# Patient Record
Sex: Female | Born: 1965 | Race: White | Hispanic: No | Marital: Married | Smoking: Former smoker
Health system: Southern US, Community
[De-identification: ages and names within clinical notes are randomized; demographics above are authoritative.]

---

## 1998-02-25 ENCOUNTER — Inpatient Hospital Stay (HOSPITAL_COMMUNITY): Admission: AD | Admit: 1998-02-25 | Discharge: 1998-02-27 | Payer: Self-pay | Admitting: Obstetrics & Gynecology

## 1998-09-15 ENCOUNTER — Other Ambulatory Visit: Admission: RE | Admit: 1998-09-15 | Discharge: 1998-09-15 | Payer: Self-pay | Admitting: Obstetrics & Gynecology

## 1999-11-14 ENCOUNTER — Other Ambulatory Visit: Admission: RE | Admit: 1999-11-14 | Discharge: 1999-11-14 | Payer: Self-pay | Admitting: Obstetrics & Gynecology

## 2000-05-06 ENCOUNTER — Other Ambulatory Visit: Admission: RE | Admit: 2000-05-06 | Discharge: 2000-05-06 | Payer: Self-pay | Admitting: Obstetrics & Gynecology

## 2000-12-27 ENCOUNTER — Other Ambulatory Visit: Admission: RE | Admit: 2000-12-27 | Discharge: 2000-12-27 | Payer: Self-pay | Admitting: Obstetrics & Gynecology

## 2001-12-29 ENCOUNTER — Other Ambulatory Visit: Admission: RE | Admit: 2001-12-29 | Discharge: 2001-12-29 | Payer: Self-pay | Admitting: Obstetrics & Gynecology

## 2003-01-20 ENCOUNTER — Other Ambulatory Visit: Admission: RE | Admit: 2003-01-20 | Discharge: 2003-01-20 | Payer: Self-pay | Admitting: Obstetrics & Gynecology

## 2004-03-03 ENCOUNTER — Other Ambulatory Visit: Admission: RE | Admit: 2004-03-03 | Discharge: 2004-03-03 | Payer: Self-pay | Admitting: Obstetrics & Gynecology

## 2005-03-13 ENCOUNTER — Other Ambulatory Visit: Admission: RE | Admit: 2005-03-13 | Discharge: 2005-03-13 | Payer: Self-pay | Admitting: Obstetrics & Gynecology

## 2008-07-15 ENCOUNTER — Encounter: Admission: RE | Admit: 2008-07-15 | Discharge: 2008-07-15 | Payer: Self-pay | Admitting: Obstetrics & Gynecology

## 2017-02-04 HISTORY — PX: BREAST BIOPSY: SHX20

## 2017-02-14 ENCOUNTER — Other Ambulatory Visit: Payer: Self-pay | Admitting: Obstetrics & Gynecology

## 2017-02-14 DIAGNOSIS — R928 Other abnormal and inconclusive findings on diagnostic imaging of breast: Secondary | ICD-10-CM

## 2017-02-20 ENCOUNTER — Ambulatory Visit
Admission: RE | Admit: 2017-02-20 | Discharge: 2017-02-20 | Disposition: A | Discharge status: Home / Self Care | Payer: BLUE CROSS/BLUE SHIELD | Source: Ambulatory Visit | Attending: Obstetrics & Gynecology | Admitting: Obstetrics & Gynecology

## 2017-02-20 ENCOUNTER — Other Ambulatory Visit: Payer: Self-pay | Admitting: Obstetrics & Gynecology

## 2017-02-20 DIAGNOSIS — N6002 Solitary cyst of left breast: Secondary | ICD-10-CM

## 2017-02-20 DIAGNOSIS — R928 Other abnormal and inconclusive findings on diagnostic imaging of breast: Secondary | ICD-10-CM

## 2017-02-26 ENCOUNTER — Ambulatory Visit
Admission: RE | Admit: 2017-02-26 | Discharge: 2017-02-26 | Disposition: A | Discharge status: Home / Self Care | Payer: BLUE CROSS/BLUE SHIELD | Source: Ambulatory Visit | Attending: Obstetrics & Gynecology | Admitting: Obstetrics & Gynecology

## 2017-02-26 ENCOUNTER — Other Ambulatory Visit: Payer: Self-pay | Admitting: Obstetrics & Gynecology

## 2017-02-26 DIAGNOSIS — N6002 Solitary cyst of left breast: Secondary | ICD-10-CM

## 2017-02-26 DIAGNOSIS — N6320 Unspecified lump in the left breast, unspecified quadrant: Secondary | ICD-10-CM

## 2017-07-18 ENCOUNTER — Other Ambulatory Visit: Payer: Self-pay | Admitting: Obstetrics & Gynecology

## 2017-07-18 DIAGNOSIS — N6012 Diffuse cystic mastopathy of left breast: Secondary | ICD-10-CM

## 2017-08-29 ENCOUNTER — Other Ambulatory Visit: Payer: BLUE CROSS/BLUE SHIELD

## 2017-09-04 ENCOUNTER — Ambulatory Visit: Payer: BLUE CROSS/BLUE SHIELD

## 2017-09-04 ENCOUNTER — Ambulatory Visit
Admission: RE | Admit: 2017-09-04 | Discharge: 2017-09-04 | Disposition: A | Discharge status: Home / Self Care | Payer: BLUE CROSS/BLUE SHIELD | Source: Ambulatory Visit | Attending: Obstetrics & Gynecology | Admitting: Obstetrics & Gynecology

## 2017-09-04 DIAGNOSIS — N6012 Diffuse cystic mastopathy of left breast: Secondary | ICD-10-CM

## 2017-10-05 ENCOUNTER — Encounter (HOSPITAL_BASED_OUTPATIENT_CLINIC_OR_DEPARTMENT_OTHER): Payer: Self-pay | Admitting: Emergency Medicine

## 2017-10-05 ENCOUNTER — Emergency Department (HOSPITAL_BASED_OUTPATIENT_CLINIC_OR_DEPARTMENT_OTHER): Payer: BLUE CROSS/BLUE SHIELD

## 2017-10-05 ENCOUNTER — Emergency Department (HOSPITAL_BASED_OUTPATIENT_CLINIC_OR_DEPARTMENT_OTHER)
Admission: EM | Admit: 2017-10-05 | Discharge: 2017-10-05 | Disposition: A | Discharge status: Home / Self Care | Payer: BLUE CROSS/BLUE SHIELD | Attending: Emergency Medicine | Admitting: Emergency Medicine

## 2017-10-05 ENCOUNTER — Other Ambulatory Visit: Payer: Self-pay

## 2017-10-05 DIAGNOSIS — Y929 Unspecified place or not applicable: Secondary | ICD-10-CM | POA: Insufficient documentation

## 2017-10-05 DIAGNOSIS — S61215A Laceration without foreign body of left ring finger without damage to nail, initial encounter: Secondary | ICD-10-CM | POA: Diagnosis not present

## 2017-10-05 DIAGNOSIS — Z23 Encounter for immunization: Secondary | ICD-10-CM | POA: Diagnosis not present

## 2017-10-05 DIAGNOSIS — Y939 Activity, unspecified: Secondary | ICD-10-CM | POA: Diagnosis not present

## 2017-10-05 DIAGNOSIS — W260XXA Contact with knife, initial encounter: Secondary | ICD-10-CM | POA: Diagnosis not present

## 2017-10-05 DIAGNOSIS — S6992XA Unspecified injury of left wrist, hand and finger(s), initial encounter: Secondary | ICD-10-CM | POA: Diagnosis present

## 2017-10-05 DIAGNOSIS — Y999 Unspecified external cause status: Secondary | ICD-10-CM | POA: Insufficient documentation

## 2017-10-05 MED ORDER — TETANUS-DIPHTH-ACELL PERTUSSIS 5-2.5-18.5 LF-MCG/0.5 IM SUSP
0.5000 mL | Freq: Once | INTRAMUSCULAR | Status: AC
  Administered 2017-10-05: 0.5 mL via INTRAMUSCULAR
  Filled 2017-10-05: qty 0.5

## 2017-10-05 MED ORDER — LIDOCAINE HCL (PF) 1 % IJ SOLN
5.0000 mL | Freq: Once | INTRAMUSCULAR | Status: AC
  Administered 2017-10-05: 5 mL
  Filled 2017-10-05: qty 5

## 2017-10-05 MED ORDER — LIDOCAINE HCL (PF) 1 % IJ SOLN
INTRAMUSCULAR | Status: AC
  Filled 2017-10-05: qty 5

## 2017-10-05 NOTE — ED Provider Notes (Addendum)
[facility] EMERGENCY DEPARTMENT Provider Note   CSN: [id number] Arrival date & time: 10/05/17  1559     History   Chief Complaint Chief Complaint  Patient presents with  . Finger Injury    HPI Shelby Meyers is a 52 y.o. female.  Patient is a 52 year old female who presents with a laceration to her left ring finger.  She was cutting some cantaloupe and the knife slipped causing laceration to her finger.  She is unsure when her last tetanus shot was.  She complains of pain to her finger.  No other injuries.     History reviewed. No pertinent past medical history.  There are no active problems to display for this patient.   Past Surgical History:  Procedure Laterality Date  . BREAST BIOPSY Left 02/2017   benign     OB History   None      Home Medications    Prior to Admission medications   Not on File    Family History History reviewed. No pertinent family history.  Social History Social History   Tobacco Use  . Smoking status: Never Smoker  . Smokeless tobacco: Never Used  Substance Use Topics  . Alcohol use: Yes    Comment: occ  . Drug use: Never     Allergies   Patient has no known allergies.   Review of Systems Review of Systems  Constitutional: Negative for fever.  Gastrointestinal: Negative for nausea and vomiting.  Musculoskeletal: Positive for arthralgias. Negative for back pain, joint swelling and neck pain.  Skin: Positive for wound.  Neurological: Negative for weakness, numbness and headaches.     Physical Exam Updated Vital Signs BP 125/78 (BP Location: Right Arm)   Pulse 64   Temp 98.4 F (36.9 C) (Oral)   Resp 16   Ht 5\' 10"  (1.778 m)   Wt 74.8 kg (165 lb)   SpO2 100%   BMI 23.68 kg/m   Physical Exam  Constitutional: She is oriented to person, place, and time. She appears well-developed and well-nourished.  HENT:  Head: Normocephalic and atraumatic.  Neck: Normal range of motion. Neck supple.    Cardiovascular: Normal rate.  Pulmonary/Chest: Effort normal.  Musculoskeletal: She exhibits tenderness. She exhibits no edema.  Patient has a C-shaped laceration 2cm in length to the pad of the left ring finger.  This is at the distal tip of the finger.  There is no nail involvement.  Has some oozing blood from the wound.  She is able to flex and extend the finger without trouble.  She has sensation to light touch distally otherwise difficult to assess given the skin flap is right on the distal tip of her finger.  Neurological: She is alert and oriented to person, place, and time.  Skin: Skin is warm and dry.  Psychiatric: She has a normal mood and affect.       ED Treatments / Results  Labs (all labs ordered are listed, but only abnormal results are displayed) Labs Reviewed - No data to display  EKG None  Radiology Dg Finger Ring Left  Result Date: 10/05/2017 CLINICAL DATA:  Laceration to the LEFT ring finger while slicing cantaloupe earlier today. Initial encounter. EXAM: LEFT RING FINGER 2+V COMPARISON:  None. FINDINGS: Soft tissue laceration involving the distal tuft. No evidence of acute fracture or dislocation. Joint spaces well preserved. Well-preserved bone mineral density. No intrinsic osseous abnormalities. No opaque foreign bodies in the soft tissues. IMPRESSION: 1. No osseous abnormality.  2. No opaque foreign bodies in soft tissues. Electronically Signed   By: Hulan Saas M.D.   On: 10/05/2017 18:28    Procedures .Marland KitchenLaceration Repair Date/Time: 10/05/2017 7:14 PM Performed by: Rolan Bucco, MD Authorized by: Rolan Bucco, MD   Consent:    Consent obtained:  Verbal   Consent given by:  Patient   Risks discussed:  Infection, need for additional repair, poor cosmetic result, pain and poor wound healing Anesthesia (see MAR for exact dosages):    Anesthesia method:  Local infiltration   Local anesthetic:  Lidocaine 1% w/o epi Repair type:    Repair type:   Simple Pre-procedure details:    Preparation:  Patient was prepped and draped in usual sterile fashion and imaging obtained to evaluate for foreign bodies Exploration:    Hemostasis achieved with:  Direct pressure   Wound exploration: entire depth of wound probed and visualized     Wound extent: no tendon damage noted and no vascular damage noted     Contaminated: no   Treatment:    Area cleansed with:  Saline   Amount of cleaning:  Standard   Irrigation solution:  Sterile saline   Irrigation method:  Syringe   Visualized foreign bodies/material removed: no   Skin repair:    Repair method:  Sutures   Suture size:  5-0   Suture material:  Prolene   Number of sutures:  7 Approximation:    Approximation:  Close Post-procedure details:    Dressing:  Non-adherent dressing and bulky dressing   Patient tolerance of procedure:  Tolerated well, no immediate complications   (including critical care time)  Medications Ordered in ED Medications  lidocaine (PF) (XYLOCAINE) 1 % injection (has no administration in time range)  lidocaine (PF) (XYLOCAINE) 1 % injection 5 mL (5 mLs Infiltration Given by Other 10/05/17 1745)  Tdap (BOOSTRIX) injection 0.5 mL (0.5 mLs Intramuscular Given 10/05/17 1745)     Initial Impression / Assessment and Plan / ED Course  I have reviewed the triage vital signs and the nursing notes.  Pertinent labs & imaging results that were available during my care of the patient were reviewed by me and considered in my medical decision making (see chart for details).     Patient has no evidence of bony injury.  The wound was repaired in the ED.  The wound edges are dusky.  I did counsel the patient that it may not heal appropriately due to this and that she needs to have a wound check within 2 to 3 days.  She was advised to have the sutures removed in 7 to 10 days.  Return precautions were given.  Final Clinical Impressions(s) / ED Diagnoses   Final diagnoses:   Laceration of left ring finger without foreign body without damage to nail, initial encounter    ED Discharge Orders    None       Rolan Bucco, MD 10/05/17 1918    Rolan Bucco, MD 10/14/17 [phone]

## 2017-10-05 NOTE — ED Notes (Signed)
ED Provider at bedside. 

## 2017-10-05 NOTE — Discharge Instructions (Signed)
You need to have the wound checked by your primary care doctor within 2 to 3 days.  You need to have the sutures removed within 7 to 10 days.

## 2017-10-05 NOTE — ED Triage Notes (Signed)
Patient states that she cut her left ring finger with a knife

## 2019-05-05 ENCOUNTER — Ambulatory Visit: Payer: BC Managed Care – PPO | Attending: Internal Medicine

## 2019-05-05 DIAGNOSIS — Z20822 Contact with and (suspected) exposure to covid-19: Secondary | ICD-10-CM

## 2019-05-06 LAB — NOVEL CORONAVIRUS, NAA: SARS-CoV-2, NAA: NOT DETECTED

## 2019-10-07 IMAGING — MG DIGITAL DIAGNOSTIC UNILATERAL LEFT MAMMOGRAM WITH TOMO AND CAD
4 series · 4 of 12 positions shown · non-contrast
Comparison: 02/26/2017, 02/20/2017, 02/12/2017 and earlier.

CLINICAL DATA: Six-month interval follow-up after benign core
needle biopsy of a cystic lesion in the LOWER OUTER QUADRANT of the
LEFT breast, pathology revealing fibrocystic changes with
calcifications and reactive changes.

EXAM:
DIGITAL DIAGNOSTIC UNILATERAL LEFT MAMMOGRAM WITH CAD AND TOMO

[L MLO synth-2D]
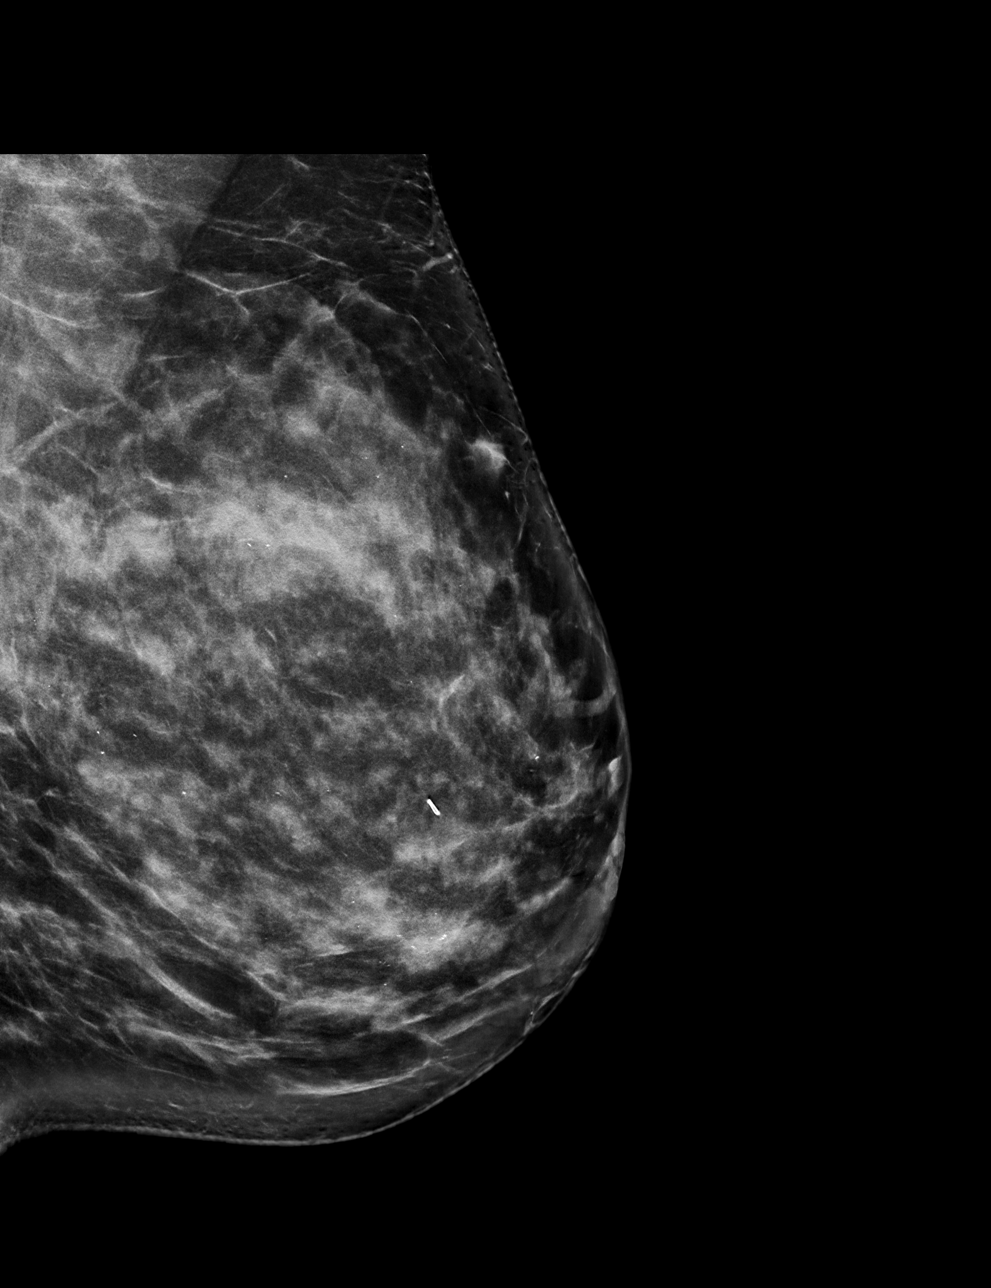

[L CC synth-2D]
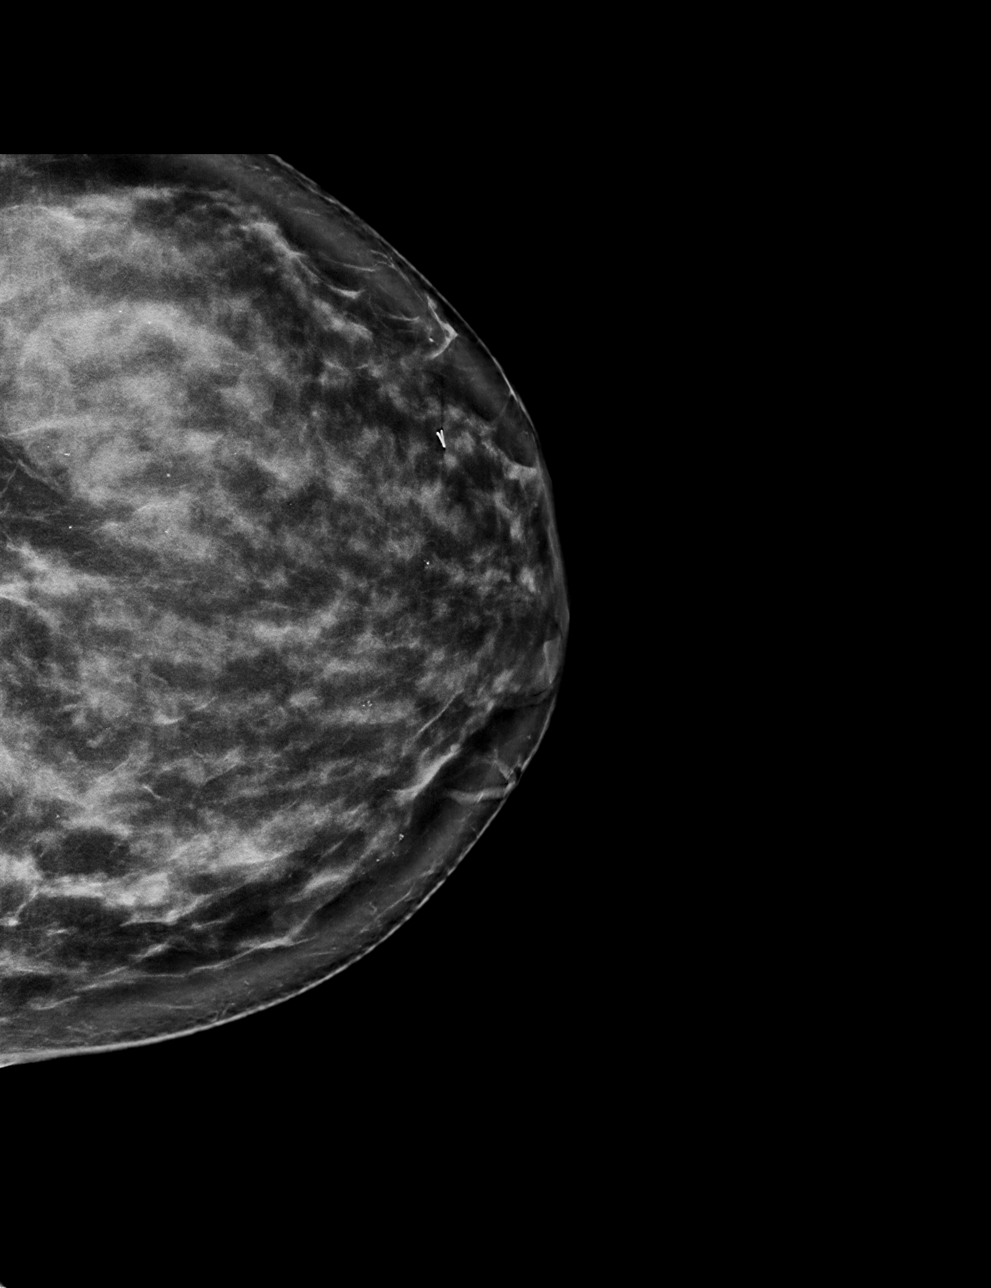

[L CC tomo · tomo slice 41/82.0]
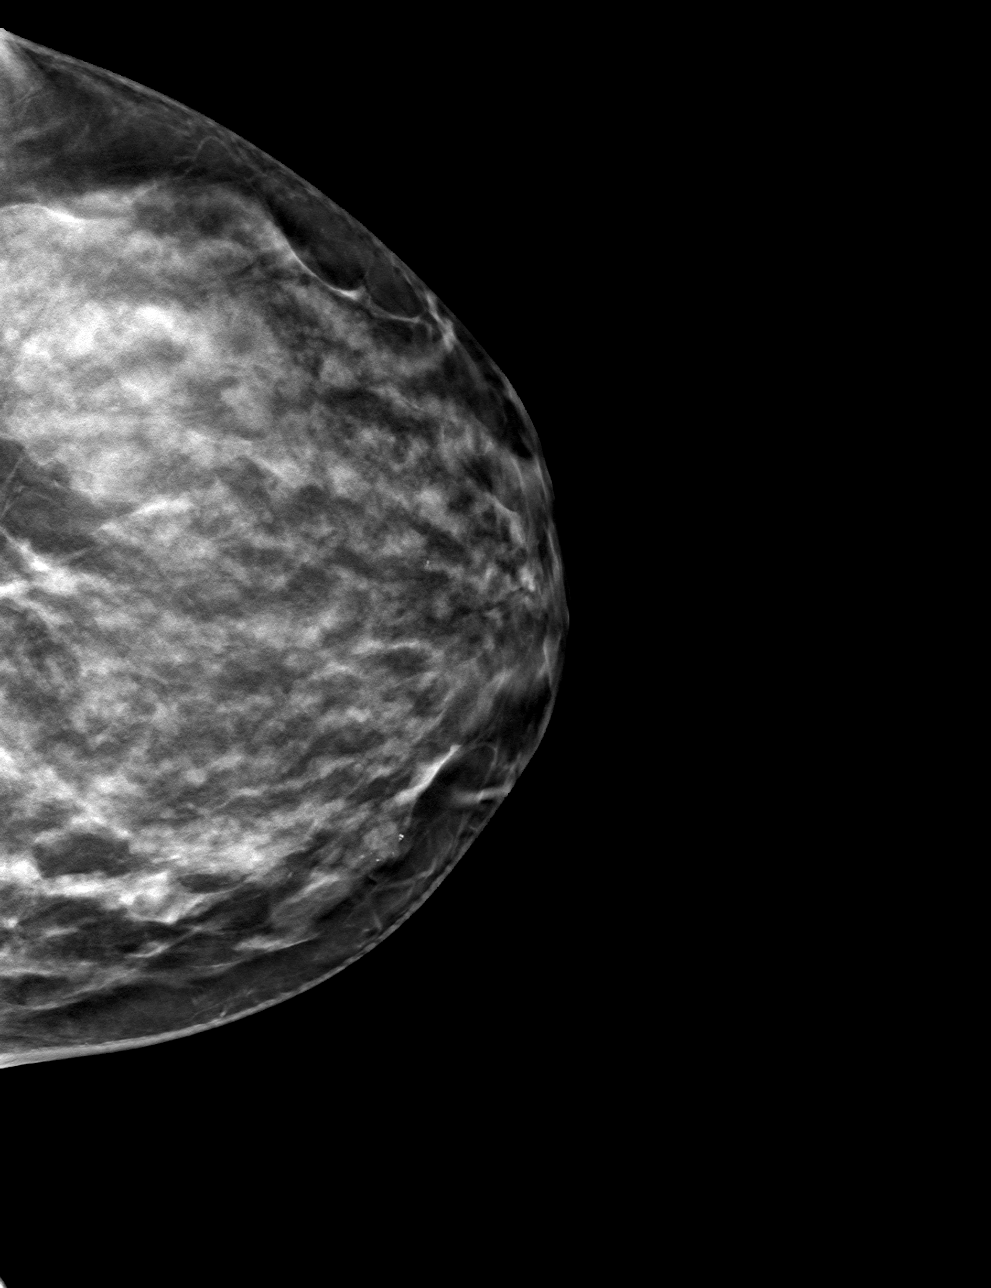

[L MLO tomo · tomo slice 41/80.0]
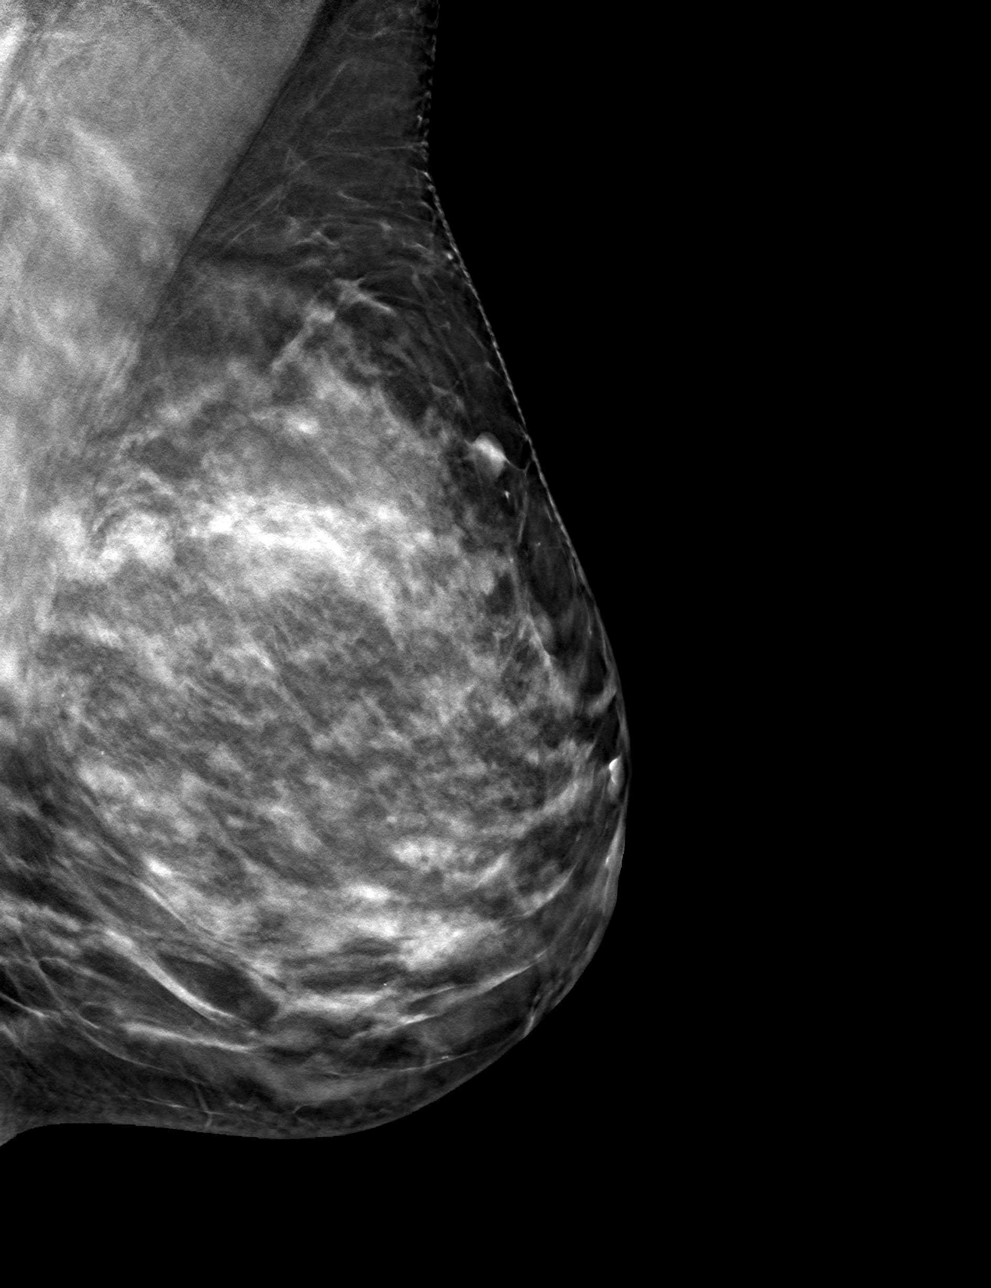

[4 of 12 positions shown; findings below may reference images not displayed]

ACR Breast Density Category d: The breast tissue is extremely dense,
which lowers the sensitivity of mammography.
FINDINGS: Tomosynthesis and synthesized full field CC and MLO views of the
left breast were obtained.

The cystic mass in the LOWER OUTER QUADRANT of the LEFT breast which
was biopsied 02/26/2017 is no longer visible. The ribbon shaped
tissue marker clip placed at the time of biopsy is present in the
LOWER OUTER QUADRANT at ANTERIOR depth. Multiple circumscribed
low-density masses are present in the LEFT breast, unchanged from
prior examinations.

No new or suspicious findings in the LEFT breast.

Mammographic images were processed with CAD.
IMPRESSION: 1. No mammographic evidence of malignancy involving the LEFT breast.
2. The previously biopsied cystic mass in the LOWER OUTER QUADRANT
of the LEFT breast has resolved, confirming benignity.

RECOMMENDATION:
Annual BILATERAL screening mammography in 6 months.

I have discussed the findings and recommendations with the patient.
Results were also provided in writing at the conclusion of the
visit. If applicable, a reminder letter will be sent to the patient
regarding the next appointment.

BI-RADS CATEGORY  2: Benign.

## 2019-10-27 ENCOUNTER — Telehealth: Payer: Self-pay | Admitting: Medical Oncology

## 2019-10-27 NOTE — Telephone Encounter (Signed)
err

## 2022-01-11 ENCOUNTER — Other Ambulatory Visit: Payer: Self-pay | Admitting: Urology

## 2022-01-22 NOTE — Progress Notes (Signed)
Anesthesia Review:  PCP: [facility] [place] [facility]- LOV 12/13/21  Cardiologist : Chest x-ray : EKG : Echo : Stress test: Cardiac Cath :  Activity level:  Sleep Study/ CPAP : Fasting Blood Sugar :      / Checks Blood Sugar -- times a day:   Blood Thinner/ Instructions /Last Dose: ASA / Instructions/ Last Dose :

## 2022-01-23 NOTE — Patient Instructions (Signed)
SURGICAL WAITING ROOM VISITATION Patients having surgery or a procedure may have no more than 2 support people in the waiting area - these visitors may rotate.   Children under the age of 19 must have an adult with them who is not the patient. If the patient needs to stay at the hospital during part of their recovery, the visitor guidelines for inpatient rooms apply. Pre-op nurse will coordinate an appropriate time for 1 support person to accompany patient in pre-op.  This support person may not rotate.    Please refer to the [facility] website for the visitor guidelines for Inpatients (after your surgery is over and you are in a regular room).       Your procedure is scheduled on:  01/26/22   1233   Report to [facility] Main Entrance    Report to admitting at  1230pm      Call this number if you have problems the morning of surgery [phone]   Do not eat food :After Midnight.   After Midnight you may have the following liquids until _ 1130_____ AMDAY OF SURGERY  Water Non-Citrus Juices (without pulp, NO RED) Carbonated Beverages Black Coffee (NO MILK/CREAM OR CREAMERS, sugar ok)  Clear Tea (NO MILK/CREAM OR CREAMERS, sugar ok) regular and decaf                             Plain Jell-O (NO RED)                                           Fruit ices (not with fruit pulp, NO RED)                                     Popsicles (NO RED)                                                               Sports drinks like Gatorade (NO RED)                            If you have questions, please contact your surgeon's office.      Oral Hygiene is also important to reduce your risk of infection.                                    Remember - BRUSH YOUR TEETH THE MORNING OF SURGERY WITH YOUR REGULAR TOOTHPASTE   Do NOT smoke after Midnight   Take these medicines the morning of surgery with A SIP OF WATER:  none   DO NOT TAKE ANY ORAL DIABETIC MEDICATIONS DAY OF YOUR  SURGERY  Bring CPAP mask and tubing day of surgery.                              You may not have any metal on your body including hair pins, jewelry, and  body piercing             Do not wear make-up, lotions, powders, perfumes/cologne, or deodorant  Do not wear nail polish including gel and S&S, artificial/acrylic nails, or any other type of covering on natural nails including finger and toenails. If you have artificial nails, gel coating, etc. that needs to be removed by a nail salon please have this removed prior to surgery or surgery may need to be canceled/ delayed if the surgeon/ anesthesia feels like they are unable to be safely monitored.   Do not shave  48 hours prior to surgery.               Men may shave face and neck.   Do not bring valuables to the hospital. [place].   Contacts, dentures or bridgework may not be worn into surgery.   Bring small overnight bag day of surgery.   DO NOT [place]. PHARMACY WILL DISPENSE MEDICATIONS LISTED ON YOUR MEDICATION LIST TO YOU DURING YOUR ADMISSION [place]!    Patients discharged on the day of surgery will not be allowed to drive home.  Someone NEEDS to stay with you for the first 24 hours after anesthesia.   Special Instructions: Bring a copy of your healthcare power of attorney and living will documents the day of surgery if you haven't scanned them before.              Please read over the following fact sheets you were given: IF [place] [phone]   If you received a COVID test during your pre-op visit  it is requested that you wear a mask when out in public, stay away from anyone that may not be feeling well and notify your surgeon if you develop symptoms. If you test positive for Covid or have been in contact with anyone that has tested positive in the last 10 days please notify  you surgeon.     [facility] - Preparing for Surgery Before surgery, you can play an important role.  Because skin is not sterile, your skin needs to be as free of germs as possible.  You can reduce the number of germs on your skin by washing with CHG (chlorahexidine gluconate) soap before surgery.  CHG is an antiseptic cleaner which kills germs and bonds with the skin to continue killing germs even after washing. Please DO NOT use if you have an allergy to CHG or antibacterial soaps.  If your skin becomes reddened/irritated stop using the CHG and inform your nurse when you arrive at Short Stay. Do not shave (including legs and underarms) for at least 48 hours prior to the first CHG shower.  You may shave your face/neck. Please follow these instructions carefully:  1.  Shower with CHG Soap the night before surgery and the  morning of Surgery.  2.  If you choose to wash your hair, wash your hair first as usual with your  normal  shampoo.  3.  After you shampoo, rinse your hair and body thoroughly to remove the  shampoo.                           4.  Use CHG as you would any other liquid soap.  You can  apply chg directly  to the skin and wash                       Gently with a scrungie or clean washcloth.  5.  Apply the CHG Soap to your body ONLY FROM THE NECK DOWN.   Do not use on face/ open                           Wound or open sores. Avoid contact with eyes, ears mouth and genitals (private parts).                       Wash face,  Genitals (private parts) with your normal soap.             6.  Wash thoroughly, paying special attention to the area where your surgery  will be performed.  7.  Thoroughly rinse your body with warm water from the neck down.  8.  DO NOT shower/wash with your normal soap after using and rinsing off  the CHG Soap.                9.  Pat yourself dry with a clean towel.            10.  Wear clean pajamas.            11.  Place clean sheets on your bed the night of your  first shower and do not  sleep with pets. Day of Surgery : Do not apply any lotions/deodorants the morning of surgery.  Please wear clean clothes to the hospital/surgery center.  FAILURE TO FOLLOW THESE INSTRUCTIONS MAY RESULT IN THE CANCELLATION OF YOUR SURGERY PATIENT SIGNATURE_________________________________  NURSE SIGNATURE__________________________________  ________________________________________________________________________

## 2022-01-24 ENCOUNTER — Encounter (HOSPITAL_COMMUNITY)
Admission: RE | Admit: 2022-01-24 | Discharge: 2022-01-24 | Disposition: A | Discharge status: Home / Self Care | Payer: BC Managed Care – PPO | Source: Ambulatory Visit | Attending: Urology | Admitting: Urology

## 2022-01-24 ENCOUNTER — Other Ambulatory Visit: Payer: Self-pay

## 2022-01-24 ENCOUNTER — Encounter (HOSPITAL_COMMUNITY): Payer: Self-pay

## 2022-01-24 VITALS — BP 117/74 | HR 71 | Temp 98.0°F | Resp 18 | Ht 70.0 in | Wt 155.0 lb

## 2022-01-24 DIAGNOSIS — Z01812 Encounter for preprocedural laboratory examination: Secondary | ICD-10-CM | POA: Insufficient documentation

## 2022-01-24 DIAGNOSIS — Z8052 Family history of malignant neoplasm of bladder: Secondary | ICD-10-CM | POA: Diagnosis not present

## 2022-01-24 DIAGNOSIS — Z87891 Personal history of nicotine dependence: Secondary | ICD-10-CM | POA: Diagnosis not present

## 2022-01-24 DIAGNOSIS — R310 Gross hematuria: Secondary | ICD-10-CM | POA: Diagnosis not present

## 2022-01-24 DIAGNOSIS — N3289 Other specified disorders of bladder: Secondary | ICD-10-CM | POA: Diagnosis not present

## 2022-01-24 DIAGNOSIS — Z01818 Encounter for other preprocedural examination: Secondary | ICD-10-CM

## 2022-01-24 LAB — CBC
HCT: 39.6 % (ref 36.0–46.0)
Hemoglobin: 13 g/dL (ref 12.0–15.0)
MCH: 30.3 pg (ref 26.0–34.0)
MCHC: 32.8 g/dL (ref 30.0–36.0)
MCV: 92.3 fL (ref 80.0–100.0)
Platelets: 231 10*3/uL (ref 150–400)
RBC: 4.29 MIL/uL (ref 3.87–5.11)
RDW: 12.4 % (ref 11.5–15.5)
WBC: 5.8 10*3/uL (ref 4.0–10.5)
nRBC: 0 % (ref 0.0–0.2)

## 2022-01-24 LAB — BASIC METABOLIC PANEL
Anion gap: 6 (ref 5–15)
BUN: 13 mg/dL (ref 6–20)
CO2: 27 mmol/L (ref 22–32)
Calcium: 10 mg/dL (ref 8.9–10.3)
Chloride: 111 mmol/L (ref 98–111)
Creatinine, Ser: 0.81 mg/dL (ref 0.44–1.00)
GFR, Estimated: >60 mL/min (ref >60)
Glucose, Bld: 147 mg/dL — ABNORMAL HIGH (ref 70–99)
Potassium: 4.7 mmol/L (ref 3.5–5.1)
Sodium: 144 mmol/L (ref 135–145)

## 2022-01-25 ENCOUNTER — Encounter (HOSPITAL_COMMUNITY): Payer: Self-pay | Admitting: Urology

## 2022-01-26 ENCOUNTER — Ambulatory Visit (HOSPITAL_COMMUNITY): Payer: BC Managed Care – PPO | Admitting: Anesthesiology

## 2022-01-26 ENCOUNTER — Encounter (HOSPITAL_COMMUNITY)
Admission: RE | Disposition: A | Discharge status: Home / Self Care | Payer: Self-pay | Source: Home / Self Care | Attending: Urology

## 2022-01-26 ENCOUNTER — Encounter (HOSPITAL_COMMUNITY): Payer: Self-pay | Admitting: Urology

## 2022-01-26 ENCOUNTER — Ambulatory Visit (HOSPITAL_COMMUNITY)
Admission: RE | Admit: 2022-01-26 | Discharge: 2022-01-26 | Disposition: A | Discharge status: Home / Self Care | Payer: BC Managed Care – PPO | Attending: Urology | Admitting: Urology

## 2022-01-26 ENCOUNTER — Ambulatory Visit (HOSPITAL_COMMUNITY): Payer: BC Managed Care – PPO

## 2022-01-26 DIAGNOSIS — N3289 Other specified disorders of bladder: Secondary | ICD-10-CM | POA: Insufficient documentation

## 2022-01-26 DIAGNOSIS — Z01818 Encounter for other preprocedural examination: Secondary | ICD-10-CM

## 2022-01-26 DIAGNOSIS — Z87891 Personal history of nicotine dependence: Secondary | ICD-10-CM | POA: Insufficient documentation

## 2022-01-26 DIAGNOSIS — Z8052 Family history of malignant neoplasm of bladder: Secondary | ICD-10-CM | POA: Insufficient documentation

## 2022-01-26 DIAGNOSIS — R310 Gross hematuria: Secondary | ICD-10-CM

## 2022-01-26 HISTORY — PX: CYSTOSCOPY WITH BIOPSY: SHX5122

## 2022-01-26 SURGERY — CYSTOSCOPY, WITH BIOPSY
Anesthesia: General

## 2022-01-26 MED ORDER — SODIUM CHLORIDE 0.9 % IR SOLN
Status: DC | PRN
  Administered 2022-01-26: 3000 mL

## 2022-01-26 MED ORDER — IOHEXOL 300 MG/ML  SOLN
INTRAMUSCULAR | Status: DC | PRN
  Administered 2022-01-26: 14 mL

## 2022-01-26 MED ORDER — MIDAZOLAM HCL 5 MG/5ML IJ SOLN
INTRAMUSCULAR | Status: DC | PRN
  Administered 2022-01-26: 2 mg via INTRAVENOUS

## 2022-01-26 MED ORDER — ONDANSETRON HCL 4 MG/2ML IJ SOLN: 4.0000 mg | Freq: Once | INTRAMUSCULAR | Status: DC | PRN

## 2022-01-26 MED ORDER — MIDAZOLAM HCL 2 MG/2ML IJ SOLN
INTRAMUSCULAR | Status: AC
  Filled 2022-01-26: qty 2

## 2022-01-26 MED ORDER — PROPOFOL 10 MG/ML IV BOLUS
INTRAVENOUS | Status: DC | PRN
  Administered 2022-01-26: 200 mg via INTRAVENOUS

## 2022-01-26 MED ORDER — FENTANYL CITRATE (PF) 100 MCG/2ML IJ SOLN
INTRAMUSCULAR | Status: DC | PRN
  Administered 2022-01-26: 100 ug via INTRAVENOUS

## 2022-01-26 MED ORDER — ORAL CARE MOUTH RINSE: 15.0000 mL | Freq: Once | OROMUCOSAL | Status: AC

## 2022-01-26 MED ORDER — FENTANYL CITRATE (PF) 100 MCG/2ML IJ SOLN
INTRAMUSCULAR | Status: AC
  Filled 2022-01-26: qty 2

## 2022-01-26 MED ORDER — OXYCODONE HCL 5 MG PO TABS: 5.0000 mg | ORAL_TABLET | Freq: Once | ORAL | Status: DC | PRN

## 2022-01-26 MED ORDER — EPHEDRINE 5 MG/ML INJ
INTRAVENOUS | Status: AC
  Filled 2022-01-26: qty 10

## 2022-01-26 MED ORDER — EPHEDRINE SULFATE-NACL 50-0.9 MG/10ML-% IV SOSY
PREFILLED_SYRINGE | INTRAVENOUS | Status: DC | PRN
  Administered 2022-01-26: 5 mg via INTRAVENOUS

## 2022-01-26 MED ORDER — HYDROMORPHONE HCL 1 MG/ML IJ SOLN: 0.2500 mg | INTRAMUSCULAR | Status: DC | PRN

## 2022-01-26 MED ORDER — LACTATED RINGERS IV SOLN: INTRAVENOUS | Status: DC | PRN

## 2022-01-26 MED ORDER — LIDOCAINE 2% (20 MG/ML) 5 ML SYRINGE
INTRAMUSCULAR | Status: DC | PRN
  Administered 2022-01-26: 60 mg via INTRAVENOUS

## 2022-01-26 MED ORDER — OXYCODONE HCL 5 MG/5ML PO SOLN: 5.0000 mg | Freq: Once | ORAL | Status: DC | PRN

## 2022-01-26 MED ORDER — CEFAZOLIN SODIUM-DEXTROSE 2-4 GM/100ML-% IV SOLN
2.0000 g | Freq: Once | INTRAVENOUS | Status: DC
  Filled 2022-01-26: qty 100

## 2022-01-26 MED ORDER — LACTATED RINGERS IV SOLN: INTRAVENOUS | Status: DC

## 2022-01-26 MED ORDER — SCOPOLAMINE 1 MG/3DAYS TD PT72
1.0000 | MEDICATED_PATCH | TRANSDERMAL | Status: DC
  Administered 2022-01-26: 1.5 mg via TRANSDERMAL
  Filled 2022-01-26: qty 1

## 2022-01-26 MED ORDER — AMISULPRIDE (ANTIEMETIC) 5 MG/2ML IV SOLN: 10.0000 mg | Freq: Once | INTRAVENOUS | Status: DC | PRN

## 2022-01-26 MED ORDER — ACETAMINOPHEN 500 MG PO TABS
1000.0000 mg | ORAL_TABLET | Freq: Once | ORAL | Status: AC
  Administered 2022-01-26: 1000 mg via ORAL
  Filled 2022-01-26: qty 2

## 2022-01-26 MED ORDER — ONDANSETRON HCL 4 MG/2ML IJ SOLN
INTRAMUSCULAR | Status: DC | PRN
  Administered 2022-01-26: 4 mg via INTRAVENOUS

## 2022-01-26 MED ORDER — DEXAMETHASONE SODIUM PHOSPHATE 10 MG/ML IJ SOLN
INTRAMUSCULAR | Status: DC | PRN
  Administered 2022-01-26: 10 mg via INTRAVENOUS

## 2022-01-26 MED ORDER — CHLORHEXIDINE GLUCONATE 0.12 % MT SOLN
15.0000 mL | Freq: Once | OROMUCOSAL | Status: AC
  Administered 2022-01-26: 15 mL via OROMUCOSAL

## 2022-01-26 MED ORDER — PHENYLEPHRINE 80 MCG/ML (10ML) SYRINGE FOR IV PUSH (FOR BLOOD PRESSURE SUPPORT)
PREFILLED_SYRINGE | INTRAVENOUS | Status: AC
  Filled 2022-01-26: qty 30

## 2022-01-26 SURGICAL SUPPLY — 18 items
BAG URINE DRAIN 2000ML AR STRL (UROLOGICAL SUPPLIES) IMPLANT
BAG URO CATCHER STRL LF (MISCELLANEOUS) ×1 IMPLANT
CATH URETL OPEN END 6FR 70 (CATHETERS) IMPLANT
DRAPE FOOT SWITCH (DRAPES) ×1 IMPLANT
ELECT REM PT RETURN 15FT ADLT (MISCELLANEOUS) ×1 IMPLANT
EVACUATOR MICROVAS BLADDER (UROLOGICAL SUPPLIES) IMPLANT
GLOVE SURG LX STRL 7.5 STRW (GLOVE) ×1 IMPLANT
GOWN STRL REUS W/ TWL XL LVL3 (GOWN DISPOSABLE) ×1 IMPLANT
GOWN STRL REUS W/TWL XL LVL3 (GOWN DISPOSABLE) ×1
KIT TURNOVER KIT A (KITS) IMPLANT
LOOP CUT BIPOLAR 24F LRG (ELECTROSURGICAL) IMPLANT
MANIFOLD NEPTUNE II (INSTRUMENTS) ×1 IMPLANT
PACK CYSTO (CUSTOM PROCEDURE TRAY) ×1 IMPLANT
PENCIL SMOKE EVACUATOR (MISCELLANEOUS) IMPLANT
SYR TOOMEY IRRIG 70ML (MISCELLANEOUS)
SYRINGE TOOMEY IRRIG 70ML (MISCELLANEOUS) IMPLANT
TUBING CONNECTING 10 (TUBING) ×1 IMPLANT
TUBING UROLOGY SET (TUBING) ×1 IMPLANT

## 2022-01-26 NOTE — Transfer of Care (Signed)
Immediate Anesthesia Transfer of Care Note  Patient: Shelby Meyers  Procedure(s) Performed: CYSTOSCOPY WITH BLADDER BIOPSY WITH FULGARATION/ BILATERAL RETROGRADE PYELOGRAM  Patient Location: PACU  Anesthesia Type:General  Level of Consciousness: awake, alert  and oriented  Airway & Oxygen Therapy: Patient Spontanous Breathing and Patient connected to face mask oxygen  Post-op Assessment: Report given to RN and Post -op Vital signs reviewed and stable  Post vital signs: Reviewed and stable  Last Vitals:  Vitals Value Taken Time  BP 122/75 01/26/22 1520  Temp    Pulse 74 01/26/22 1522  Resp 16 01/26/22 1522  SpO2 100 % 01/26/22 1522  Vitals shown include unvalidated device data.  Last Pain:  Vitals:   01/26/22 1246  TempSrc: Oral  PainSc:       Patients Stated Pain Goal: 4 (75/88/32 5498)  Complications: No notable events documented.

## 2022-01-26 NOTE — H&P (Signed)
Office Visit Report     12/28/2021     CC/HPI: Shelby Meyers is a 56 year old female seen in consultation today for gross hematuria.   1. Gross hematuria:  -Pt reports a history of gross hematuria over the past 2 weeks beginning in 12/2021. She states this has been intermittent and mostly painless. She was seen by her gynecologist and urine culture was reportedly negative. At the time, she had microscopic hematuria.  -She states this morning, she voided grossly bloody urine however urine sample left today was yellow without sign of infection.  -She has remote smoking history smoking 1/2 pack/day for 15 years. She quit 20 years ago. Her father was diagnosed with bladder cancer.  Creatinine on 12/13/2021 was 0.81 according to GFR of 86.  -Cystoscopy 12/28/2021 with multiple erythematous lesions throughout her bladder consistent with either inflammation however there is some concern for possible CIS.   She denies abdominal pain, flank pain, fevers, chills, dysuria.     ALLERGIES: Bactrim    MEDICATIONS: No Medications    GU PSH: No GU PSH    NON-GU PSH: No Non-GU PSH    GU PMH: No GU PMH    NON-GU PMH: No Non-GU PMH    FAMILY HISTORY: 1 Daughter - Runs in Family 1 son - Runs in Family Bladder Cancer - Runs in Family Blood in the urine - Runs in Family Breast Cancer - Runs in Family Lung Cancer - Runs in Family   SOCIAL HISTORY: Marital Status: Married Ethnicity: Not Hispanic Or Latino; Race: White Current Smoking Status: Patient does not smoke anymore. Has not smoked since 12/05/2001. Smoked for 14 years.   Tobacco Use Assessment Completed: Used Tobacco in last 30 days? Does drink.  Drinks 2 caffeinated drinks per day.    REVIEW OF SYSTEMS:    GU Review Female:   Patient reports frequent urination, burning /pain with urination, get up at night to urinate, and leakage of urine. Patient denies hard to postpone urination, stream starts and stops, trouble starting your stream,  have to strain to urinate, and being pregnant.  Gastrointestinal (Upper):   Patient denies nausea, vomiting, and indigestion/ heartburn.  Gastrointestinal (Lower):   Patient denies diarrhea and constipation.  Constitutional:   Patient denies weight loss, fatigue, fever, and night sweats.  Skin:   Patient denies skin rash/ lesion and itching.  Eyes:   Patient denies blurred vision and double vision.  Ears/ Nose/ Throat:   Patient denies sore throat and sinus problems.  Hematologic/Lymphatic:   Patient denies swollen glands and easy bruising.  Cardiovascular:   Patient denies leg swelling and chest pains.  Respiratory:   Patient denies cough and shortness of breath.  Endocrine:   Patient denies excessive thirst.  Musculoskeletal:   Patient denies back pain and joint pain.  Neurological:   Patient denies headaches and dizziness.  Psychologic:   Patient denies depression and anxiety.   VITAL SIGNS:      12/28/2021 10:48 AM  Weight 155 lb / 70.31 kg  Height 70 in / 177.8 cm  BP 136/84 mmHg  Pulse 80 /min  Temperature 97.8 F / 36.5 C  BMI 22.2 kg/m   MULTI-SYSTEM PHYSICAL EXAMINATION:    Constitutional: Well-nourished. No physical deformities. Normally developed. Good grooming.  Respiratory: No labored breathing, no use of accessory muscles.   Cardiovascular: Normal temperature, normal extremity pulses, no swelling, no varicosities.  Gastrointestinal: No mass, no tenderness, no rigidity, non obese abdomen.     Complexity of Data:  Source Of History:  Patient, Medical Record Summary  Records Review:   Previous Doctor Records, Previous Hospital Records, Previous Patient Records  Urine Test Review:   Urinalysis   PROCEDURES:         Flexible Cystoscopy - 52000  Risks, benefits, and some of the potential complications of the procedure were discussed at length with the patient including infection, bleeding, voiding discomfort, urinary retention, fever, chills, sepsis, and others. All  questions were answered. Informed consent was obtained. Antibiotic prophylaxis was given. Sterile technique and intraurethral analgesia were used.  Meatus:  Normal size. Normal location. Normal condition.  Urethra:  No hypermobility. No leakage.  Ureteral Orifices:  Normal location. Normal size. Normal shape. Effluxed clear urine.  Bladder:  Multiple erythematous lesions throughout her trigone, left and right lateral walls, dome that are suspicious for CIS. No papillary appearing lesions.      The lower urinary tract was carefully examined. The procedure was well-tolerated and without complications. Antibiotic instructions were given. Instructions were given to call the office immediately for bloody urine, difficulty urinating, urinary retention, painful or frequent urination, fever, chills, nausea, vomiting or other illness. The patient stated that she understood these instructions and would comply with them.         Urinalysis w/Scope - 81001 Dipstick Dipstick Cont'd Micro  Color: Red Bilirubin: Invalid mg/dL WBC/hpf: 20 - 40/hpf  Appearance: Turbid Ketones: Invalid mg/dL RBC/hpf: >60/hpf  Specific Gravity: Invalid Blood: Invalid ery/uL Bacteria: Mod (26-50/hpf)  pH: Invalid Protein: Invalid mg/dL Cystals: NS (Not Seen)  Glucose: Invalid mg/dL Urobilinogen: Invalid mg/dL Casts: NS (Not Seen)    Nitrites: Invalid Trichomonas: Not Present    Leukocyte Esterase: Invalid leu/uL Mucous: Not Present      Epithelial Cells: NS (Not Seen)      Yeast: NS (Not Seen)      Sperm: Not Present    Notes: Unspun micro due to increased RBC's.          Urinalysis w/Scope - 81001 Dipstick Dipstick Cont'd Micro  Color: Yellow Bilirubin: Neg mg/dL WBC/hpf: 40 - 60/hpf  Appearance: Slightly Cloudy Ketones: Neg mg/dL RBC/hpf: 0 - 2/hpf  Specific Gravity: <=1.005 Blood: 3+ ery/uL Bacteria: Few (10-25/hpf)  pH: 5.5 Protein: Neg mg/dL Cystals: NS (Not Seen)  Glucose: Neg mg/dL Urobilinogen: 0.2 mg/dL Casts:  NS (Not Seen)    Nitrites: Neg Trichomonas: Not Present    Leukocyte Esterase: 3+ leu/uL Mucous: Not Present      Epithelial Cells: NS (Not Seen)      Yeast: NS (Not Seen)      Sperm: Not Present         Urinalysis w/Scope - 81001 Dipstick Dipstick Cont'd Micro  Color: Yellow Bilirubin: Neg WBC/hpf: 40 - 60/hpf  Appearance: Cloudy Ketones: Neg RBC/hpf: 0 - 2/hpf  Specific Gravity: <= 1.005 Blood: 3+ Bacteria: Few (10-25/hpf)  pH: 5.5 Protein: Neg Cystals: NS (Not Seen)  Glucose: Neg Urobilinogen: 0.2 Casts: NS (Not Seen)    Nitrites: Neg Trichomonas: Not Present    Leukocyte Esterase: 3+ Mucous: Not Present      Epithelial Cells: NS (Not Seen)      Yeast: NS (Not Seen)      Sperm: Not Present    ASSESSMENT:      ICD-10 Details  1 GU:   Gross hematuria - R31.0   2   Bladder tumor/neoplasm - D41.4    PLAN:           Orders Labs CULTURE, URINE, BMP, Urine  Cytology  X-Rays: C.T. Hematuria With and Without I.V. Contrast          Schedule         Document Letter(s):  Created for Patient: Clinical Summary         Notes:   1. Gross hematuria/bladder lesions:  -Cystoscopy today with multiple erythematous lesions over her bladder concerning for possible CIS.  -We will send urine for cytology.  -We will obtain CT hematuria protocol.  -Of note, her recent creatinine was appropriate at 0.81 in 12/2021.  I recommend cystoscopy, bladder biopsy, bilateral retrograde pyelograms, possible TURBT in the operating room. Surgery letter sent. Discussed risk of surgery including infection, bleeding, bladder perforation.   Risks and benefits of Transurethral Resection of Bladder Tumor were reviewed in detail including infection, bleeding, blood transfusion, injury to bladder/urethra/surrounding structures, bladder perforation, obstructive and irritative voiding symptoms, and global anesthesia risks including but not limited to CVA, MI, DVT, PE, pneumonia, and death. Patient expressed  understanding and desire to proceed.    Urology Preoperative H&P   Chief Complaint: Gross hematuria, bladder lesions  History of Present Illness: Shelby Meyers is a 56 y.o. female with gross hematuria and bladder lesions here for cysto, b/l RPG, bladder biopsies.    History reviewed. No pertinent past medical history.  Past Surgical History:  Procedure Laterality Date   BREAST BIOPSY Left 02/2017   benign    Allergies:  Allergies  Allergen Reactions   Penicillins Nausea And Vomiting and Rash   Nitrofurantoin Macrocrystal Other (See Comments)    Flu like symptoms    History reviewed. No pertinent family history.  Social History:  reports that she has quit smoking. Her smoking use included cigarettes. She has never used smokeless tobacco. She reports current alcohol use. She reports that she does not use drugs.  ROS: A complete review of systems was performed.  All systems are negative except for pertinent findings as noted.  Physical Exam:  Vital signs in last 24 hours:   Constitutional:  Alert and oriented, No acute distress Cardiovascular: Regular rate and rhythm Respiratory: Normal respiratory effort, Lungs clear bilaterally GI: Abdomen is soft, nontender, nondistended, no abdominal masses GU: No CVA tenderness Lymphatic: No lymphadenopathy Neurologic: Grossly intact, no focal deficits Psychiatric: Normal mood and affect  Laboratory Data:  Recent Labs    01/24/22 1500  WBC 5.8  HGB 13.0  HCT 39.6  PLT 231    Recent Labs    01/24/22 1500  NA 144  K 4.7  CL 111  GLUCOSE 147*  BUN 13  CALCIUM 10.0  CREATININE 0.81     No results found for this or any previous visit (from the past 24 hour(s)). No results found for this or any previous visit (from the past 240 hour(s)).  Renal Function: Recent Labs    01/24/22 1500  CREATININE 0.81   Estimated Creatinine Clearance: 84.9 mL/min (by C-G formula based on SCr of 0.81 mg/dL).  Radiologic  Imaging: No results found.  I independently reviewed the above imaging studies.  Assessment and Plan Shelby Meyers is a 56 y.o. female with gross hematuria, bladder lesions here for cystoscopy, b/ RPG, bladder biopsies, possible TURBT.   Risks and benefits of Transurethral Resection of Bladder Tumor were reviewed in detail including infection, bleeding, blood transfusion, injury to bladder/urethra/surrounding structures, bladder perforation, obstructive and irritative voiding symptoms, and global anesthesia risks including but not limited to CVA, MI, DVT, PE, pneumonia, and death. Patient expressed understanding and desire  to proceed.   Shelby R. Khadijah Mastrianni MD 01/26/2022, 12:12 PM  [facility] Pager: [phone]

## 2022-01-26 NOTE — Anesthesia Procedure Notes (Signed)
Procedure Name: LMA Insertion Date/Time: 01/26/2022 3:00 PM  Performed by: Gean Maidens, CRNAPre-anesthesia Checklist: Patient identified, Emergency Drugs available, Suction available, Patient being monitored and Timeout performed Patient Re-evaluated:Patient Re-evaluated prior to induction Oxygen Delivery Method: Circle system utilized Preoxygenation: Pre-oxygenation with 100% oxygen Induction Type: IV induction Ventilation: Mask ventilation without difficulty LMA: LMA inserted LMA Size: 4.0 Number of attempts: 1 Placement Confirmation: positive ETCO2 and breath sounds checked- equal and bilateral Secured at: 23 cm Tube secured with: Tape

## 2022-01-26 NOTE — Op Note (Signed)
Operative Note  Preoperative diagnosis:  1.  Gross hematuria 2. Bladder lesions  Postoperative diagnosis: 1.  Same  Procedure(s): 1.  Cystoscopy 2.  Bilateral retrograde pyelogram 3.  Bladder biopsy 4.  Fulguration of bladder  Surgeon: Rexene Alberts, MD  Assistants:  None  Anesthesia:  General  Complications:  None  EBL: Minimal  Specimens: 1.  Bladder trigone lesion ID Type Source Tests Collected by Time Destination  1 : Bladder Trigone Biopsy Tissue PATH GU biopsy SURGICAL PATHOLOGY Janith Lima, MD 01/26/2022 1505    Drains/Catheters: 1.  None  Intraoperative findings:   Bilateral retrograde pyelograms with no hydronephrosis, no filling defects and no extravasation of urine. Overall, resolved erythematous lesions other than a small area within the trigone.  This was biopsied in its entirety and then fulgurated with excellent hemostasis.  Indication:  Shelby Meyers is a 56 y.o. female with history of gross hematuria.  Intraoffice cystoscopy demonstrated erythematous areas throughout her bladder concerning for possible CIS.  This all the risks, benefits were discussed with the patient to include but not limited to infection, pain, bleeding, damage to adjacent structures, need for further operations, adverse reaction to anesthesia and death.  Patient understands these risks and agrees to proceed with the operation as planned.    Description of procedure: After informed consent was obtained from the patient, the patient was taken to the operating room. General anesthesia was administered. The patient was placed in dorsal lithotomy position and prepped and draped in usual sterile fashion. Sequential compression devices were applied to lower extremities at the beginning of the case for DVT prophylaxis. Antibiotics were infused prior to surgery start time. A surgical time-out was performed to properly identify the patient, the surgery to be performed, and the surgical site.      We then passed the 21-French rigid cystoscope down the urethra and into the bladder under direct vision without any difficulty. The anterior urethral was normal. The ureteral orfices were in orthotopic position and not involved.  I intubated the right ureteral orifice with a 5 French catheter and perform right retrograde pyelogram.  There is no evidence of hydronephrosis, filling defect or extravasation of urine.  In similar fashion, I have the left ureter orifice with a 5 [place] open-ended catheter perform left retrograde pyelogram.  There is no evidence of hydronephrosis, Foley defect or extravasation of urine.  Both kidneys drained promptly.  Calyces were thin and delicate.  Her bladder fortunately had resolved erythematous lesions other than one aspect of the trigone.  I then introduced a cold cup biopsy and biopsy of this area.  This was removed in its entirety.  I then introduced Bugbee cautery and fulgurated this area with excellent hemostasis.  Her bladder was drained and she tolerated well with no complications.  Matt R. [facility]  Pager: [phone]

## 2022-01-26 NOTE — Anesthesia Postprocedure Evaluation (Signed)
Anesthesia Post Note  Patient: Shelby Meyers  Procedure(s) Performed: CYSTOSCOPY WITH BLADDER BIOPSY WITH FULGARATION/ BILATERAL RETROGRADE PYELOGRAM     Patient location during evaluation: PACU Anesthesia Type: General Level of consciousness: awake and alert, oriented and patient cooperative Pain management: pain level controlled Vital Signs Assessment: post-procedure vital signs reviewed and stable Respiratory status: spontaneous breathing, nonlabored ventilation and respiratory function stable Cardiovascular status: blood pressure returned to baseline and stable Postop Assessment: no apparent nausea or vomiting Anesthetic complications: no   No notable events documented.  Last Vitals:  Vitals:   01/26/22 1545 01/26/22 1600  BP: 118/71 116/68  Pulse: 61 (!) 51  Resp: 14 10  Temp:  (!) 36.4 C  SpO2: 99% 100%    Last Pain:  Vitals:   01/26/22 1600  TempSrc:   PainSc: 0-No pain                 Pervis Hocking

## 2022-01-26 NOTE — Discharge Instructions (Signed)

## 2022-01-26 NOTE — Anesthesia Preprocedure Evaluation (Addendum)
Anesthesia Evaluation  Patient identified by MRN, date of birth, ID band Patient awake    Reviewed: Allergy & Precautions, NPO status , Patient's Chart, lab work & pertinent test results  Airway Mallampati: III  TM Distance: >3 FB Neck ROM: Full    Dental no notable dental hx.    Pulmonary neg pulmonary ROS, former smoker,  Snores at night   Pulmonary exam normal breath sounds clear to auscultation       Cardiovascular negative cardio ROS Normal cardiovascular exam Rhythm:Regular Rate:Normal     Neuro/Psych negative neurological ROS  negative psych ROS   GI/Hepatic negative GI ROS, Neg liver ROS,   Endo/Other  negative endocrine ROS  Renal/GU negative Renal ROS Bladder dysfunction      Musculoskeletal negative musculoskeletal ROS (+)   Abdominal   Peds  Hematology Hb 13   Anesthesia Other Findings   Reproductive/Obstetrics negative OB ROS                            Anesthesia Physical Anesthesia Plan  ASA: 2  Anesthesia Plan: General   Post-op Pain Management: Tylenol PO (pre-op)*   Induction: Intravenous  PONV Risk Score and Plan: 4 or greater and Ondansetron, Dexamethasone, Midazolam, Treatment may vary due to age or medical condition and Scopolamine patch - Pre-op  Airway Management Planned: Oral ETT  Additional Equipment: None  Intra-op Plan:   Post-operative Plan: Extubation in OR  Informed Consent: I have reviewed the patients History and Physical, chart, labs and discussed the procedure including the risks, benefits and alternatives for the proposed anesthesia with the patient or authorized representative who has indicated his/her understanding and acceptance.     Dental advisory given  Plan Discussed with: CRNA  Anesthesia Plan Comments:         Anesthesia Quick Evaluation

## 2022-01-27 ENCOUNTER — Encounter (HOSPITAL_COMMUNITY): Payer: Self-pay | Admitting: Urology

## 2022-01-29 LAB — SURGICAL PATHOLOGY

## 2022-02-09 ENCOUNTER — Other Ambulatory Visit: Payer: Self-pay | Admitting: Obstetrics & Gynecology

## 2022-02-09 DIAGNOSIS — R928 Other abnormal and inconclusive findings on diagnostic imaging of breast: Secondary | ICD-10-CM

## 2022-02-19 ENCOUNTER — Ambulatory Visit
Admission: RE | Admit: 2022-02-19 | Discharge: 2022-02-19 | Disposition: A | Discharge status: Home / Self Care | Payer: BC Managed Care – PPO | Source: Ambulatory Visit | Attending: Obstetrics & Gynecology | Admitting: Obstetrics & Gynecology

## 2022-02-19 ENCOUNTER — Ambulatory Visit: Payer: BC Managed Care – PPO

## 2022-02-19 DIAGNOSIS — R928 Other abnormal and inconclusive findings on diagnostic imaging of breast: Secondary | ICD-10-CM
